# Patient Record
Sex: Female | Born: 1970 | Race: Black or African American | Hispanic: No | Marital: Single | Smoking: Never smoker
Health system: Southern US, Community
[De-identification: ages and names within clinical notes are randomized; demographics above are authoritative.]

## PROBLEM LIST (undated history)

## (undated) DIAGNOSIS — I10 Essential (primary) hypertension: Secondary | ICD-10-CM

---

## 2002-03-28 ENCOUNTER — Other Ambulatory Visit: Admission: RE | Admit: 2002-03-28 | Discharge: 2002-03-28 | Payer: Self-pay | Admitting: Family Medicine

## 2003-05-01 ENCOUNTER — Other Ambulatory Visit: Admission: RE | Admit: 2003-05-01 | Discharge: 2003-05-01 | Payer: Self-pay | Admitting: Family Medicine

## 2004-06-26 ENCOUNTER — Encounter: Admission: RE | Admit: 2004-06-26 | Discharge: 2004-06-26 | Payer: Self-pay | Admitting: Family Medicine

## 2004-09-29 ENCOUNTER — Other Ambulatory Visit: Admission: RE | Admit: 2004-09-29 | Discharge: 2004-09-29 | Payer: Self-pay | Admitting: Family Medicine

## 2005-09-28 ENCOUNTER — Other Ambulatory Visit: Admission: RE | Admit: 2005-09-28 | Discharge: 2005-09-28 | Payer: Self-pay | Admitting: Family Medicine

## 2007-10-04 ENCOUNTER — Other Ambulatory Visit: Admission: RE | Admit: 2007-10-04 | Discharge: 2007-10-04 | Payer: Self-pay | Admitting: Family Medicine

## 2008-10-05 ENCOUNTER — Other Ambulatory Visit: Admission: RE | Admit: 2008-10-05 | Discharge: 2008-10-05 | Payer: Self-pay | Admitting: Family Medicine

## 2009-08-30 ENCOUNTER — Encounter: Admission: RE | Admit: 2009-08-30 | Discharge: 2009-08-30 | Payer: Self-pay | Admitting: Family Medicine

## 2010-10-28 ENCOUNTER — Encounter: Admission: RE | Admit: 2010-10-28 | Discharge: 2010-10-28 | Payer: Self-pay | Admitting: Family Medicine

## 2010-11-11 ENCOUNTER — Encounter: Admission: RE | Admit: 2010-11-11 | Discharge: 2010-11-11 | Payer: Self-pay | Admitting: Family Medicine

## 2010-11-12 ENCOUNTER — Encounter: Admission: RE | Admit: 2010-11-12 | Discharge: 2010-11-12 | Payer: Self-pay | Admitting: Family Medicine

## 2011-10-08 ENCOUNTER — Other Ambulatory Visit: Payer: Self-pay | Admitting: Family Medicine

## 2011-10-08 DIAGNOSIS — Z1231 Encounter for screening mammogram for malignant neoplasm of breast: Secondary | ICD-10-CM

## 2011-10-30 ENCOUNTER — Other Ambulatory Visit: Payer: Self-pay | Admitting: Family Medicine

## 2011-10-30 DIAGNOSIS — E049 Nontoxic goiter, unspecified: Secondary | ICD-10-CM

## 2011-11-03 ENCOUNTER — Ambulatory Visit
Admission: RE | Admit: 2011-11-03 | Discharge: 2011-11-03 | Disposition: A | Discharge status: Home / Self Care | Payer: BC Managed Care – PPO | Source: Ambulatory Visit | Attending: Family Medicine | Admitting: Family Medicine

## 2011-11-03 DIAGNOSIS — E049 Nontoxic goiter, unspecified: Secondary | ICD-10-CM

## 2011-11-16 ENCOUNTER — Ambulatory Visit
Admission: RE | Admit: 2011-11-16 | Discharge: 2011-11-16 | Disposition: A | Discharge status: Home / Self Care | Payer: BC Managed Care – PPO | Source: Ambulatory Visit | Attending: Family Medicine | Admitting: Family Medicine

## 2011-11-16 DIAGNOSIS — Z1231 Encounter for screening mammogram for malignant neoplasm of breast: Secondary | ICD-10-CM

## 2012-02-01 ENCOUNTER — Other Ambulatory Visit: Payer: Self-pay | Admitting: Family Medicine

## 2012-02-01 DIAGNOSIS — E041 Nontoxic single thyroid nodule: Secondary | ICD-10-CM

## 2012-02-09 ENCOUNTER — Ambulatory Visit
Admission: RE | Admit: 2012-02-09 | Discharge: 2012-02-09 | Disposition: A | Discharge status: Home / Self Care | Payer: BC Managed Care – PPO | Source: Ambulatory Visit | Attending: Family Medicine | Admitting: Family Medicine

## 2012-02-09 ENCOUNTER — Other Ambulatory Visit (HOSPITAL_COMMUNITY)
Admission: RE | Admit: 2012-02-09 | Discharge: 2012-02-09 | Disposition: A | Discharge status: Home / Self Care | Payer: BC Managed Care – PPO | Source: Ambulatory Visit | Attending: Interventional Radiology | Admitting: Interventional Radiology

## 2012-02-09 DIAGNOSIS — E041 Nontoxic single thyroid nodule: Secondary | ICD-10-CM

## 2012-02-09 DIAGNOSIS — E049 Nontoxic goiter, unspecified: Secondary | ICD-10-CM | POA: Insufficient documentation

## 2012-11-01 ENCOUNTER — Other Ambulatory Visit: Payer: Self-pay | Admitting: Family Medicine

## 2012-11-01 ENCOUNTER — Other Ambulatory Visit (HOSPITAL_COMMUNITY)
Admission: RE | Admit: 2012-11-01 | Discharge: 2012-11-01 | Disposition: A | Discharge status: Home / Self Care | Payer: BC Managed Care – PPO | Source: Ambulatory Visit | Attending: Family Medicine | Admitting: Family Medicine

## 2012-11-01 DIAGNOSIS — Z1231 Encounter for screening mammogram for malignant neoplasm of breast: Secondary | ICD-10-CM

## 2012-11-01 DIAGNOSIS — Z1151 Encounter for screening for human papillomavirus (HPV): Secondary | ICD-10-CM | POA: Insufficient documentation

## 2012-11-01 DIAGNOSIS — Z124 Encounter for screening for malignant neoplasm of cervix: Secondary | ICD-10-CM | POA: Insufficient documentation

## 2012-12-06 ENCOUNTER — Ambulatory Visit
Admission: RE | Admit: 2012-12-06 | Discharge: 2012-12-06 | Disposition: A | Discharge status: Home / Self Care | Payer: BC Managed Care – PPO | Source: Ambulatory Visit | Attending: Family Medicine | Admitting: Family Medicine

## 2012-12-06 DIAGNOSIS — Z1231 Encounter for screening mammogram for malignant neoplasm of breast: Secondary | ICD-10-CM

## 2013-10-31 ENCOUNTER — Other Ambulatory Visit: Payer: Self-pay

## 2013-10-31 DIAGNOSIS — Z1231 Encounter for screening mammogram for malignant neoplasm of breast: Secondary | ICD-10-CM

## 2013-12-07 ENCOUNTER — Ambulatory Visit: Payer: BC Managed Care – PPO

## 2013-12-11 ENCOUNTER — Ambulatory Visit
Admission: RE | Admit: 2013-12-11 | Discharge: 2013-12-11 | Disposition: A | Discharge status: Home / Self Care | Payer: BC Managed Care – PPO | Source: Ambulatory Visit

## 2013-12-11 DIAGNOSIS — Z1231 Encounter for screening mammogram for malignant neoplasm of breast: Secondary | ICD-10-CM

## 2014-12-27 ENCOUNTER — Other Ambulatory Visit: Payer: Self-pay

## 2014-12-27 DIAGNOSIS — Z1231 Encounter for screening mammogram for malignant neoplasm of breast: Secondary | ICD-10-CM

## 2015-01-02 ENCOUNTER — Ambulatory Visit
Admission: RE | Admit: 2015-01-02 | Discharge: 2015-01-02 | Disposition: A | Discharge status: Home / Self Care | Payer: BLUE CROSS/BLUE SHIELD | Source: Ambulatory Visit

## 2015-01-02 DIAGNOSIS — Z1231 Encounter for screening mammogram for malignant neoplasm of breast: Secondary | ICD-10-CM

## 2015-12-02 ENCOUNTER — Other Ambulatory Visit: Payer: Self-pay

## 2015-12-02 DIAGNOSIS — Z1231 Encounter for screening mammogram for malignant neoplasm of breast: Secondary | ICD-10-CM

## 2016-01-06 ENCOUNTER — Inpatient Hospital Stay: Admission: RE | Admit: 2016-01-06 | Payer: BLUE CROSS/BLUE SHIELD | Source: Ambulatory Visit

## 2016-01-16 ENCOUNTER — Ambulatory Visit
Admission: RE | Admit: 2016-01-16 | Discharge: 2016-01-16 | Disposition: A | Discharge status: Home / Self Care | Payer: BLUE CROSS/BLUE SHIELD | Source: Ambulatory Visit

## 2016-01-16 DIAGNOSIS — Z1231 Encounter for screening mammogram for malignant neoplasm of breast: Secondary | ICD-10-CM

## 2016-01-28 ENCOUNTER — Ambulatory Visit
Admission: RE | Admit: 2016-01-28 | Discharge: 2016-01-28 | Disposition: A | Discharge status: Home / Self Care | Payer: BLUE CROSS/BLUE SHIELD | Source: Ambulatory Visit

## 2016-12-23 ENCOUNTER — Other Ambulatory Visit: Payer: Self-pay | Admitting: Family Medicine

## 2016-12-23 DIAGNOSIS — Z1231 Encounter for screening mammogram for malignant neoplasm of breast: Secondary | ICD-10-CM

## 2017-01-29 ENCOUNTER — Ambulatory Visit
Admission: RE | Admit: 2017-01-29 | Discharge: 2017-01-29 | Disposition: A | Discharge status: Home / Self Care | Payer: BLUE CROSS/BLUE SHIELD | Source: Ambulatory Visit | Attending: Family Medicine | Admitting: Family Medicine

## 2017-01-29 DIAGNOSIS — Z1231 Encounter for screening mammogram for malignant neoplasm of breast: Secondary | ICD-10-CM

## 2017-11-29 ENCOUNTER — Other Ambulatory Visit: Payer: Self-pay | Admitting: Family Medicine

## 2017-11-29 ENCOUNTER — Other Ambulatory Visit (HOSPITAL_COMMUNITY)
Admission: RE | Admit: 2017-11-29 | Discharge: 2017-11-29 | Disposition: A | Discharge status: Home / Self Care | Payer: BLUE CROSS/BLUE SHIELD | Source: Ambulatory Visit | Attending: Family Medicine | Admitting: Family Medicine

## 2017-11-29 DIAGNOSIS — R7303 Prediabetes: Secondary | ICD-10-CM | POA: Diagnosis not present

## 2017-11-29 DIAGNOSIS — Z124 Encounter for screening for malignant neoplasm of cervix: Secondary | ICD-10-CM | POA: Insufficient documentation

## 2017-11-29 DIAGNOSIS — I10 Essential (primary) hypertension: Secondary | ICD-10-CM | POA: Diagnosis not present

## 2017-11-29 DIAGNOSIS — E042 Nontoxic multinodular goiter: Secondary | ICD-10-CM | POA: Diagnosis not present

## 2017-11-29 DIAGNOSIS — Z800 Family history of malignant neoplasm of digestive organs: Secondary | ICD-10-CM | POA: Diagnosis not present

## 2017-11-29 DIAGNOSIS — N926 Irregular menstruation, unspecified: Secondary | ICD-10-CM | POA: Diagnosis not present

## 2017-11-29 DIAGNOSIS — Z0000 Encounter for general adult medical examination without abnormal findings: Secondary | ICD-10-CM | POA: Diagnosis not present

## 2017-11-29 DIAGNOSIS — E6609 Other obesity due to excess calories: Secondary | ICD-10-CM | POA: Diagnosis not present

## 2017-11-30 ENCOUNTER — Other Ambulatory Visit: Payer: Self-pay | Admitting: Family Medicine

## 2017-11-30 DIAGNOSIS — E049 Nontoxic goiter, unspecified: Secondary | ICD-10-CM

## 2017-11-30 LAB — CYTOLOGY - PAP
Adequacy: ABSENT
Diagnosis: NEGATIVE
HPV: NOT DETECTED

## 2017-12-02 ENCOUNTER — Ambulatory Visit
Admission: RE | Admit: 2017-12-02 | Discharge: 2017-12-02 | Disposition: A | Discharge status: Home / Self Care | Payer: BLUE CROSS/BLUE SHIELD | Source: Ambulatory Visit | Attending: Family Medicine | Admitting: Family Medicine

## 2017-12-02 DIAGNOSIS — E049 Nontoxic goiter, unspecified: Secondary | ICD-10-CM

## 2017-12-02 DIAGNOSIS — E041 Nontoxic single thyroid nodule: Secondary | ICD-10-CM | POA: Diagnosis not present

## 2017-12-13 ENCOUNTER — Other Ambulatory Visit: Payer: Self-pay | Admitting: Family Medicine

## 2017-12-13 DIAGNOSIS — E042 Nontoxic multinodular goiter: Secondary | ICD-10-CM

## 2018-01-10 ENCOUNTER — Other Ambulatory Visit: Payer: Self-pay | Admitting: Family Medicine

## 2018-01-10 DIAGNOSIS — Z139 Encounter for screening, unspecified: Secondary | ICD-10-CM

## 2018-02-01 ENCOUNTER — Ambulatory Visit
Admission: RE | Admit: 2018-02-01 | Discharge: 2018-02-01 | Disposition: A | Discharge status: Home / Self Care | Payer: BLUE CROSS/BLUE SHIELD | Source: Ambulatory Visit | Attending: Family Medicine | Admitting: Family Medicine

## 2018-02-01 DIAGNOSIS — Z1231 Encounter for screening mammogram for malignant neoplasm of breast: Secondary | ICD-10-CM | POA: Diagnosis not present

## 2018-02-01 DIAGNOSIS — Z139 Encounter for screening, unspecified: Secondary | ICD-10-CM

## 2018-03-19 DIAGNOSIS — J309 Allergic rhinitis, unspecified: Secondary | ICD-10-CM | POA: Diagnosis not present

## 2018-03-30 DIAGNOSIS — J309 Allergic rhinitis, unspecified: Secondary | ICD-10-CM | POA: Diagnosis not present

## 2018-06-03 ENCOUNTER — Ambulatory Visit
Admission: RE | Admit: 2018-06-03 | Discharge: 2018-06-03 | Disposition: A | Discharge status: Home / Self Care | Payer: BLUE CROSS/BLUE SHIELD | Source: Ambulatory Visit | Attending: Family Medicine | Admitting: Family Medicine

## 2018-06-03 DIAGNOSIS — E041 Nontoxic single thyroid nodule: Secondary | ICD-10-CM | POA: Diagnosis not present

## 2018-06-03 DIAGNOSIS — E042 Nontoxic multinodular goiter: Secondary | ICD-10-CM

## 2018-06-08 ENCOUNTER — Other Ambulatory Visit: Payer: Self-pay | Admitting: Family Medicine

## 2018-06-15 ENCOUNTER — Other Ambulatory Visit: Payer: Self-pay | Admitting: Family Medicine

## 2018-06-15 DIAGNOSIS — E041 Nontoxic single thyroid nodule: Secondary | ICD-10-CM

## 2018-06-29 ENCOUNTER — Ambulatory Visit
Admission: RE | Admit: 2018-06-29 | Discharge: 2018-06-29 | Disposition: A | Discharge status: Home / Self Care | Payer: BLUE CROSS/BLUE SHIELD | Source: Ambulatory Visit | Attending: Family Medicine | Admitting: Family Medicine

## 2018-06-29 ENCOUNTER — Other Ambulatory Visit (HOSPITAL_COMMUNITY)
Admission: RE | Admit: 2018-06-29 | Discharge: 2018-06-29 | Disposition: A | Discharge status: Home / Self Care | Payer: BLUE CROSS/BLUE SHIELD | Source: Ambulatory Visit | Attending: Radiology | Admitting: Radiology

## 2018-06-29 DIAGNOSIS — E041 Nontoxic single thyroid nodule: Secondary | ICD-10-CM | POA: Diagnosis not present

## 2018-09-29 IMAGING — US US THYROID
1 series · 12 of 25 positions shown · non-contrast
Comparison: Most recent prior thyroid ultrasound 12/02/2017; prior
thyroid nodule biopsy 02/09/2012; prior thyroid ultrasound
11/03/2011

CLINICAL DATA: Goiter. 47-year-old female with a history of
multinodular thyroid goiter. She underwent prior biopsy the right
inferior nodule in Sunday January, 2012.

EXAM:
THYROID ULTRASOUND
TECHNIQUE: Ultrasound examination of the thyroid gland and adjacent soft
tissues was performed.

[Series 1: us thyroid · 0.06mm/px · 12 of 56 slices shown]
[im 3/56]
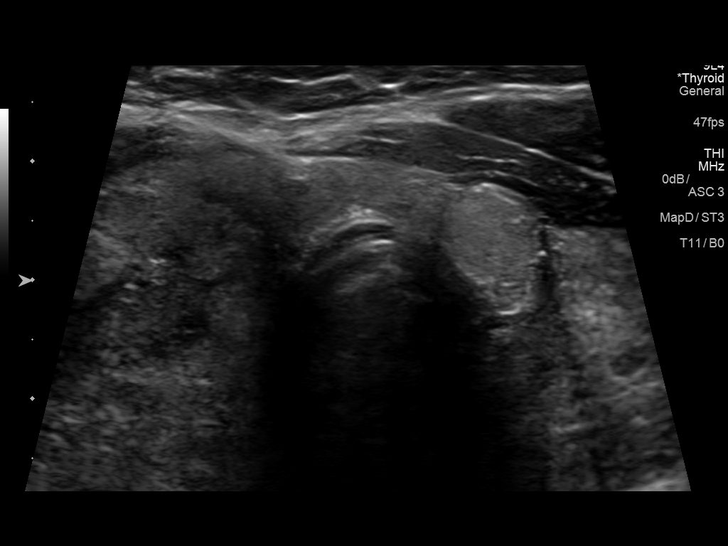
[im 7/56]
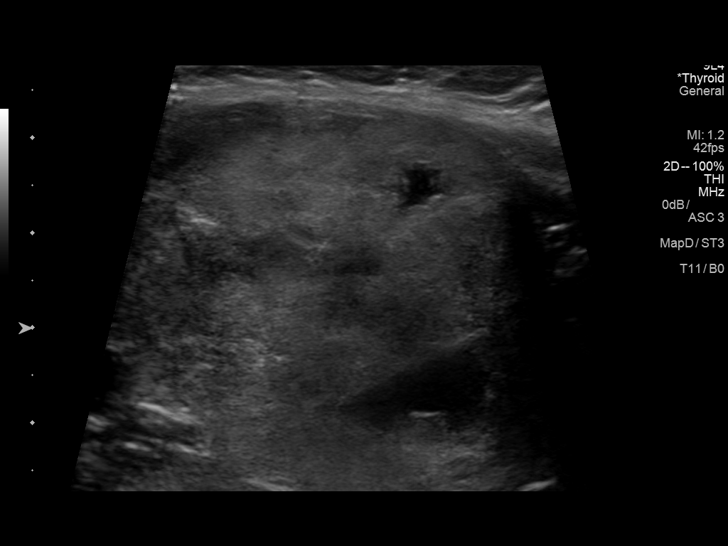
[im 12/56]
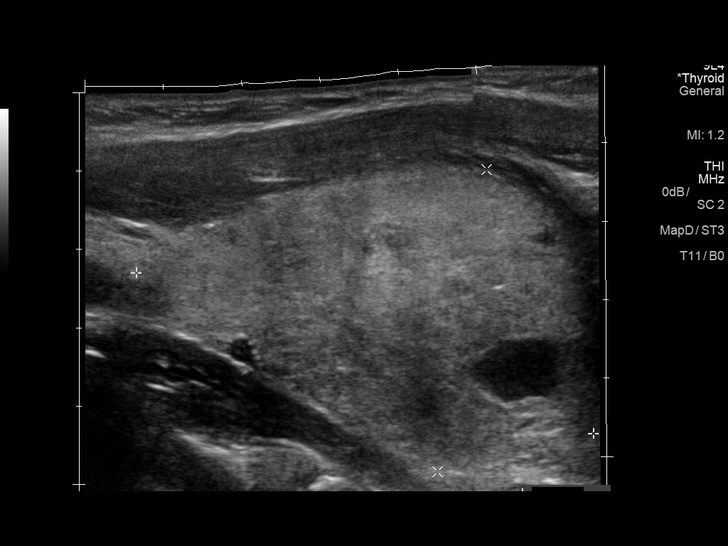
[im 17/56]
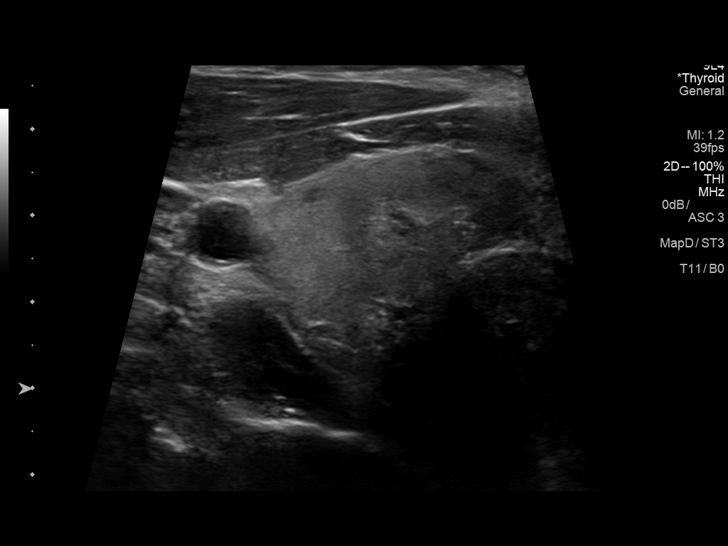
[im 21/56]
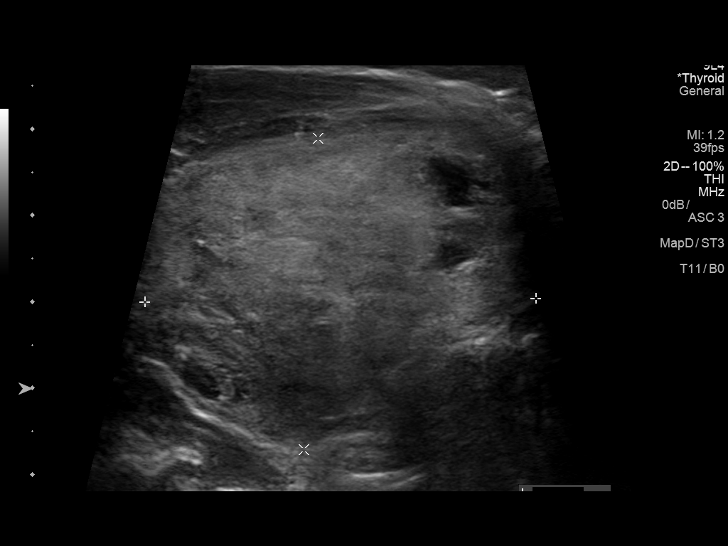
[im 26/56]
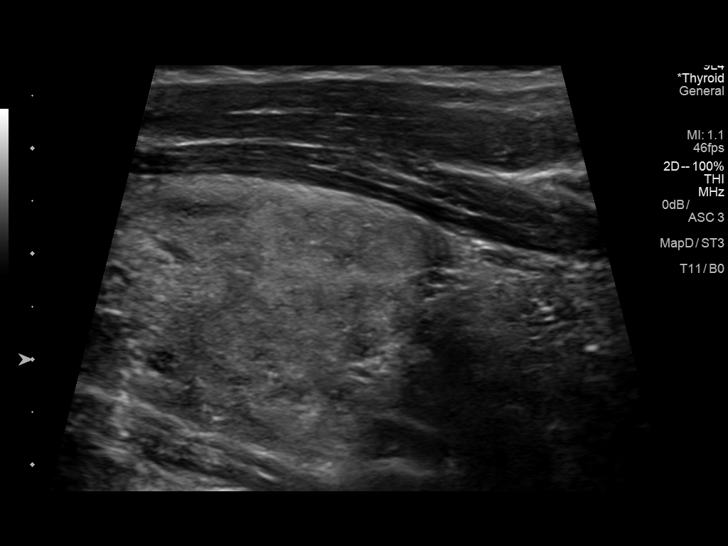
[im 30/56]
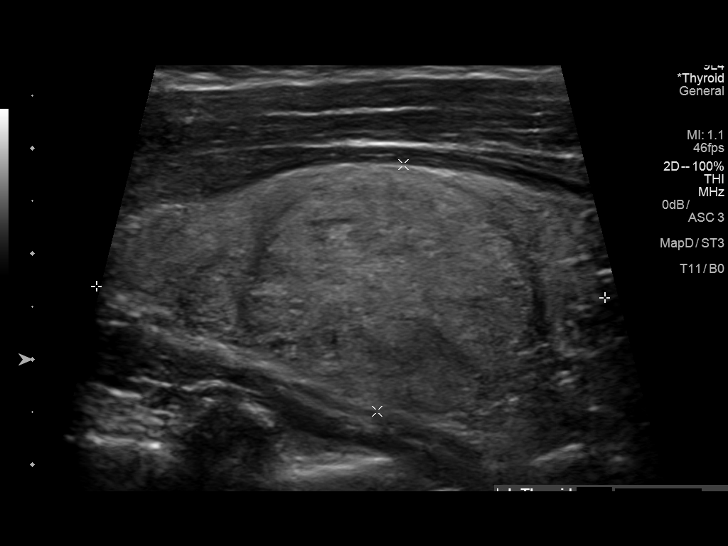
[im 35/56]
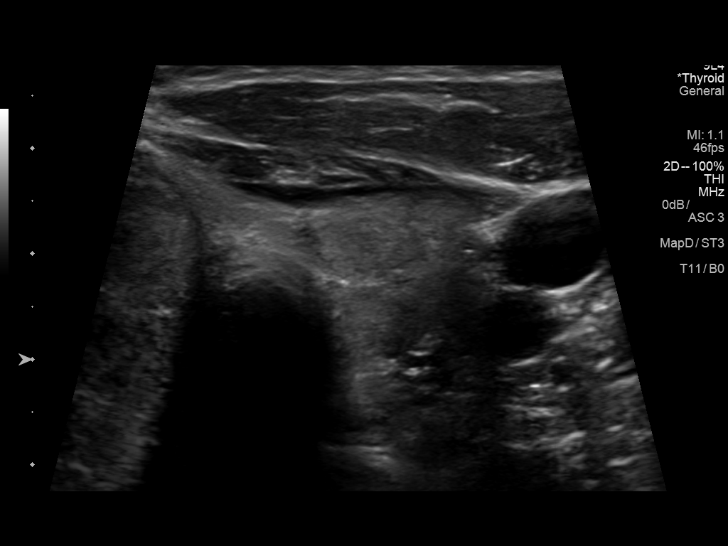
[im 39/56]
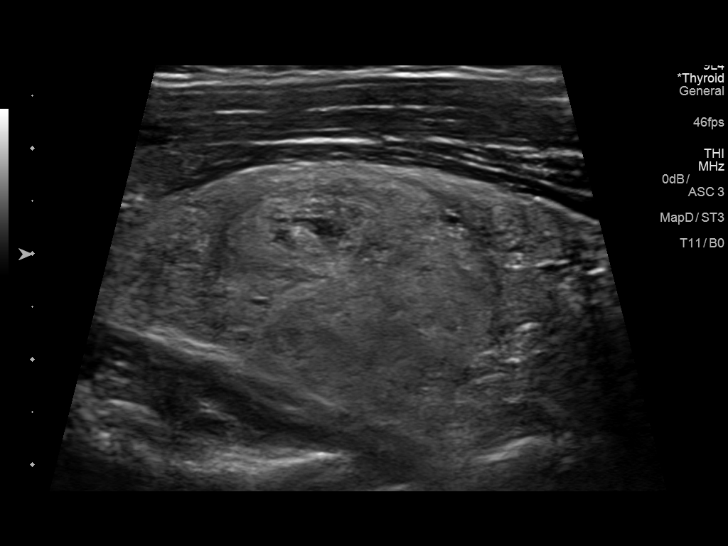
[im 44/56]
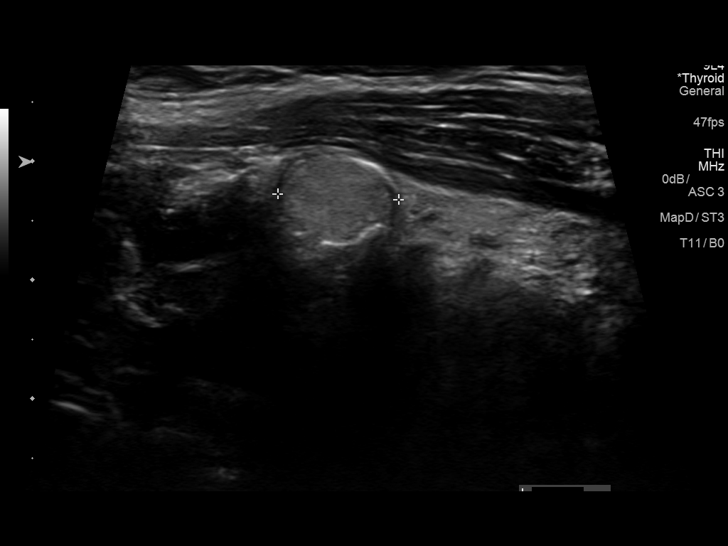
[im 49/56]
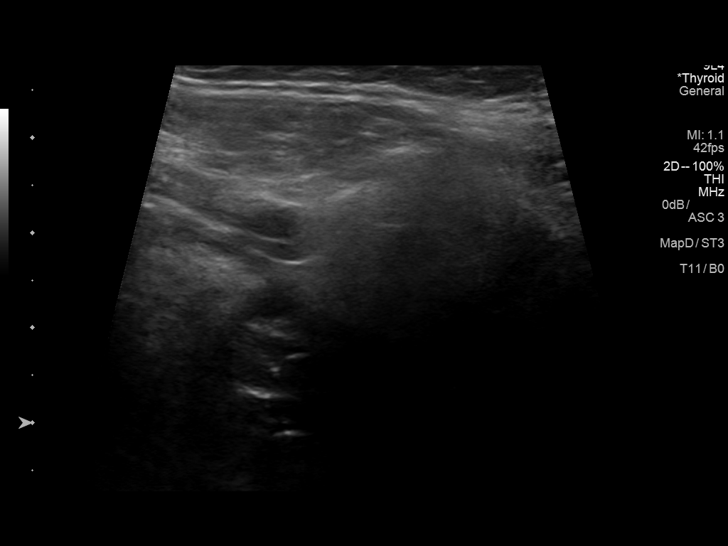
[im 53/56]
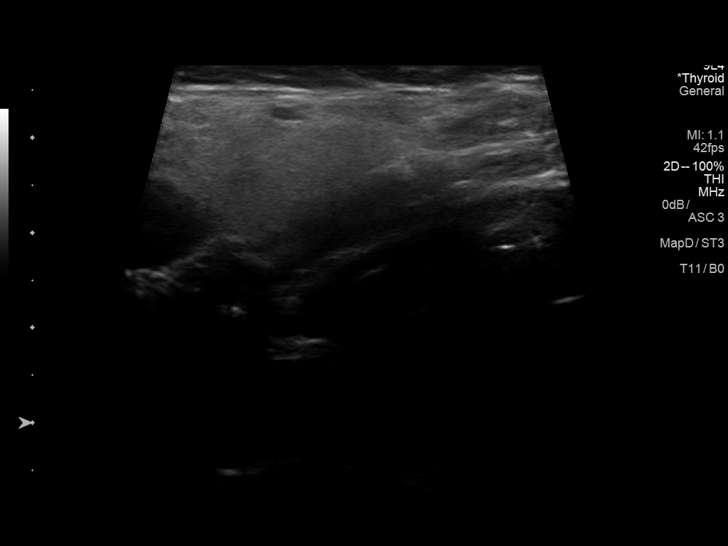

[12 of 25 positions shown; findings below may reference images not displayed]

FINDINGS: Parenchymal Echotexture: Markedly heterogenous

Isthmus: 0.5 cm

Right lobe: 6.2 x 3.9 x 4.1 cm

Left lobe: 5.1 x 2.3 x 2.4 cm

_________________________________________________________

Estimated total number of nodules >/= 1 cm: 4

Number of spongiform nodules >/=  2 cm not described below (TR1): 0

Number of mixed cystic and solid nodules >/= 1.5 cm not described
below (TR2): 0

_________________________________________________________

Nodule # 3:

Prior biopsy: No

Location: Left; Mid

Maximum size: 2.6 cm; Other 2 dimensions: 2.1 x 2.1 cm, previously,
2.4 x 1.9 x 2.0 cm

Composition: solid/almost completely solid (2)

Echogenicity: isoechoic (1)

Shape: not taller-than-wide (0)

Margins: smooth (0)

Echogenic foci: none (0)

ACR TI-RADS total points: 3.

ACR TI-RADS risk category:  TR3 (3 points).

Significant change in size (>/= 20% in two dimensions and minimal
increase of 2 mm): Yes

Change in features: No

Change in ACR TI-RADS risk category: No

ACR TI-RADS recommendations:

**Given size (>/= 2.5 cm) and appearance, fine needle aspiration of
this mildly suspicious nodule should be considered based on TI-RADS
criteria.

_________________________________________________________

Nodule # 4:

Prior biopsy: No

Location: Left; Mid

Maximum size: 1.0 cm; Other 2 dimensions: 0.9 x 0.8 cm, previously,
1.1 x 0.8 x 0.8 cm

Composition: solid/almost completely solid (2)

Echogenicity: isoechoic (1)

Shape: not taller-than-wide (0)

Margins: smooth (0)

Echogenic foci: peripheral calcifications (2)

ACR TI-RADS total points: 5.

ACR TI-RADS risk category:  TR4 (4-6 points).

Significant change in size (>/= 20% in two dimensions and minimal
increase of 2 mm): No

Change in features: No

Change in ACR TI-RADS risk category: No

ACR TI-RADS recommendations:

Nodule remains stable dating back to 8218 confirming greater than 5
year stability. No further follow-up required.

_________________________________________________________

The previously biopsied mass occupying the majority of the right
inferior gland now measures 4.5 x 4.2 x 3.6 cm, very similar
compared to 4.4 x 3.6 x 2.6 cm previously. Precise measurements are
difficult given the large size of the lesion. Small TI-RADS category
3 nodule in the left superior gland does not meet size criteria for
further evaluation.
IMPRESSION: 1. Probable continued slow interval growth of the previously
biopsied right inferior thyroid nodule. Recommend correlation with
prior biopsy results.
2. Interval growth of the TI-RADS category 3 nodule in the left mid
gland. This nodule now meets criteria for consideration of
fine-needle aspiration biopsy.
3. Confirmed 5 year stability of the peripherally calcified TI-RADS
category 4 nodule in the left medial mid gland. This is consistent
with benignity and no further follow-up is required for this nodule.

The above is in keeping with the ACR TI-RADS recommendations - [HOSPITAL] 3482;[DATE].

## 2018-11-30 DIAGNOSIS — D649 Anemia, unspecified: Secondary | ICD-10-CM | POA: Diagnosis not present

## 2018-11-30 DIAGNOSIS — Z6839 Body mass index (BMI) 39.0-39.9, adult: Secondary | ICD-10-CM | POA: Diagnosis not present

## 2018-11-30 DIAGNOSIS — R7303 Prediabetes: Secondary | ICD-10-CM | POA: Diagnosis not present

## 2018-11-30 DIAGNOSIS — Z0000 Encounter for general adult medical examination without abnormal findings: Secondary | ICD-10-CM | POA: Diagnosis not present

## 2018-11-30 DIAGNOSIS — I10 Essential (primary) hypertension: Secondary | ICD-10-CM | POA: Diagnosis not present

## 2018-12-30 ENCOUNTER — Other Ambulatory Visit: Payer: Self-pay | Admitting: Family Medicine

## 2018-12-30 DIAGNOSIS — Z1231 Encounter for screening mammogram for malignant neoplasm of breast: Secondary | ICD-10-CM

## 2019-02-02 ENCOUNTER — Ambulatory Visit
Admission: RE | Admit: 2019-02-02 | Discharge: 2019-02-02 | Disposition: A | Discharge status: Home / Self Care | Payer: BLUE CROSS/BLUE SHIELD | Source: Ambulatory Visit | Attending: Family Medicine | Admitting: Family Medicine

## 2019-02-02 DIAGNOSIS — Z1231 Encounter for screening mammogram for malignant neoplasm of breast: Secondary | ICD-10-CM | POA: Diagnosis not present

## 2019-02-03 ENCOUNTER — Other Ambulatory Visit: Payer: Self-pay | Admitting: Family Medicine

## 2019-02-03 DIAGNOSIS — R928 Other abnormal and inconclusive findings on diagnostic imaging of breast: Secondary | ICD-10-CM

## 2019-02-08 ENCOUNTER — Other Ambulatory Visit: Payer: Self-pay | Admitting: Family Medicine

## 2019-02-08 ENCOUNTER — Ambulatory Visit
Admission: RE | Admit: 2019-02-08 | Discharge: 2019-02-08 | Disposition: A | Discharge status: Home / Self Care | Payer: BLUE CROSS/BLUE SHIELD | Source: Ambulatory Visit | Attending: Family Medicine | Admitting: Family Medicine

## 2019-02-08 DIAGNOSIS — N63 Unspecified lump in unspecified breast: Secondary | ICD-10-CM

## 2019-02-08 DIAGNOSIS — N6489 Other specified disorders of breast: Secondary | ICD-10-CM | POA: Diagnosis not present

## 2019-02-08 DIAGNOSIS — R928 Other abnormal and inconclusive findings on diagnostic imaging of breast: Secondary | ICD-10-CM

## 2019-08-10 ENCOUNTER — Other Ambulatory Visit: Payer: Self-pay | Admitting: Family Medicine

## 2019-08-10 ENCOUNTER — Ambulatory Visit
Admission: RE | Admit: 2019-08-10 | Discharge: 2019-08-10 | Disposition: A | Discharge status: Home / Self Care | Payer: BC Managed Care – PPO | Source: Ambulatory Visit | Attending: Family Medicine | Admitting: Family Medicine

## 2019-08-10 ENCOUNTER — Other Ambulatory Visit: Payer: Self-pay

## 2019-08-10 ENCOUNTER — Ambulatory Visit: Admission: RE | Admit: 2019-08-10 | Payer: BLUE CROSS/BLUE SHIELD | Source: Ambulatory Visit

## 2019-08-10 DIAGNOSIS — N63 Unspecified lump in unspecified breast: Secondary | ICD-10-CM

## 2019-08-10 DIAGNOSIS — R928 Other abnormal and inconclusive findings on diagnostic imaging of breast: Secondary | ICD-10-CM | POA: Diagnosis not present

## 2019-12-11 DIAGNOSIS — I10 Essential (primary) hypertension: Secondary | ICD-10-CM | POA: Diagnosis not present

## 2019-12-11 DIAGNOSIS — D649 Anemia, unspecified: Secondary | ICD-10-CM | POA: Diagnosis not present

## 2020-02-13 ENCOUNTER — Other Ambulatory Visit: Payer: Self-pay

## 2020-02-13 ENCOUNTER — Ambulatory Visit
Admission: RE | Admit: 2020-02-13 | Discharge: 2020-02-13 | Disposition: A | Discharge status: Home / Self Care | Payer: BC Managed Care – PPO | Source: Ambulatory Visit | Attending: Family Medicine | Admitting: Family Medicine

## 2020-02-13 DIAGNOSIS — N63 Unspecified lump in unspecified breast: Secondary | ICD-10-CM

## 2020-12-13 ENCOUNTER — Ambulatory Visit: Payer: Self-pay | Attending: Internal Medicine

## 2020-12-13 DIAGNOSIS — Z23 Encounter for immunization: Secondary | ICD-10-CM

## 2020-12-13 NOTE — Progress Notes (Signed)
   Covid-19 Vaccination Clinic  Name:  Lauren Le    MRN: [id number] DOB: [birth year]  12/13/2020  [name] was observed post Covid-19 immunization for 15 minutes without incident. She was provided with Vaccine Information Sheet and instruction to access the V-Safe system.   [name] was instructed to call 911 with any severe reactions post vaccine: Marland Kitchen Difficulty breathing  . Swelling of face and throat  . A fast heartbeat  . A bad rash all over body  . Dizziness and weakness   Immunizations Administered    Name Date Dose VIS Date Route   Pfizer COVID-19 Vaccine 12/13/2020  5:11 PM 0.3 mL 10/16/2020 Intramuscular   Manufacturer: [employer], [employer]   Lot: [id number]   NDC: [address]-6

## 2021-01-09 ENCOUNTER — Other Ambulatory Visit: Payer: Self-pay | Admitting: Family Medicine

## 2021-01-09 DIAGNOSIS — N6320 Unspecified lump in the left breast, unspecified quadrant: Secondary | ICD-10-CM

## 2021-02-25 ENCOUNTER — Ambulatory Visit
Admission: RE | Admit: 2021-02-25 | Discharge: 2021-02-25 | Disposition: A | Discharge status: Home / Self Care | Payer: 59 | Source: Ambulatory Visit | Attending: Family Medicine | Admitting: Family Medicine

## 2021-02-25 ENCOUNTER — Other Ambulatory Visit: Payer: Self-pay

## 2021-02-25 DIAGNOSIS — N6320 Unspecified lump in the left breast, unspecified quadrant: Secondary | ICD-10-CM

## 2022-01-19 ENCOUNTER — Other Ambulatory Visit: Payer: Self-pay | Admitting: Family Medicine

## 2022-01-19 DIAGNOSIS — Z1231 Encounter for screening mammogram for malignant neoplasm of breast: Secondary | ICD-10-CM

## 2022-03-03 ENCOUNTER — Ambulatory Visit
Admission: RE | Admit: 2022-03-03 | Discharge: 2022-03-03 | Disposition: A | Discharge status: Home / Self Care | Payer: No Typology Code available for payment source | Source: Ambulatory Visit | Attending: Family Medicine | Admitting: Family Medicine

## 2022-03-03 DIAGNOSIS — Z1231 Encounter for screening mammogram for malignant neoplasm of breast: Secondary | ICD-10-CM

## 2022-10-23 IMAGING — MG DIGITAL DIAGNOSTIC BILAT W/ TOMO W/ CAD
8 of 15 series · 8 of 40 positions shown · non-contrast
Comparison: Previous exam(s).

CLINICAL DATA: 50-year-old female presenting for follow-up of a
likely benign left breast mass without sonographic correlate.

EXAM:
DIGITAL DIAGNOSTIC BILATERAL MAMMOGRAM WITH TOMOSYNTHESIS AND CAD
TECHNIQUE: Bilateral digital diagnostic mammography and breast tomosynthesis
was performed. The images were evaluated with computer-aided
detection.

[L MLO synth-2D (1 of 2)]
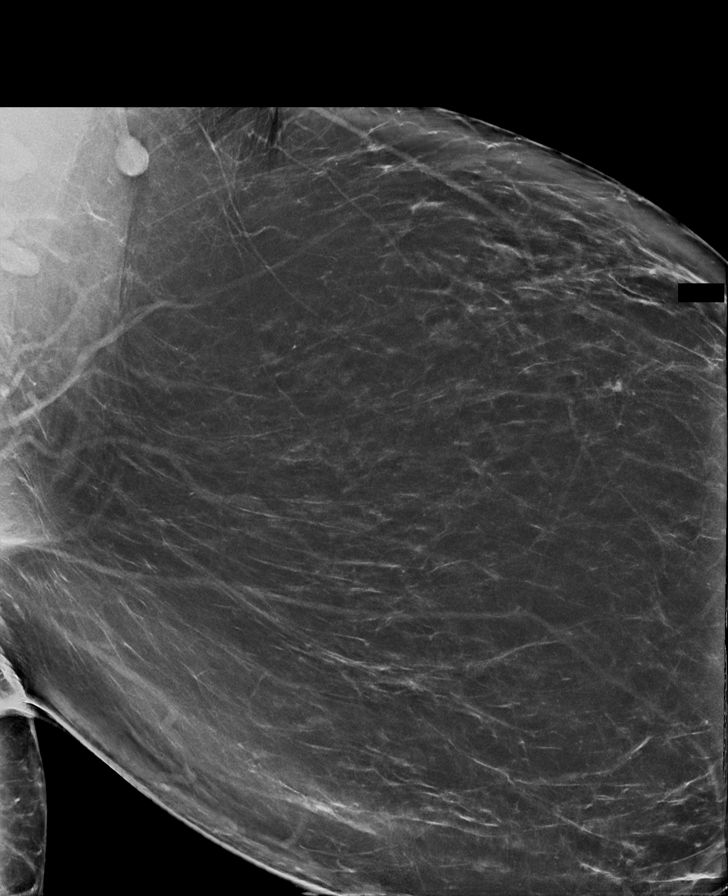

[R CC synth-2D (1 of 2)]
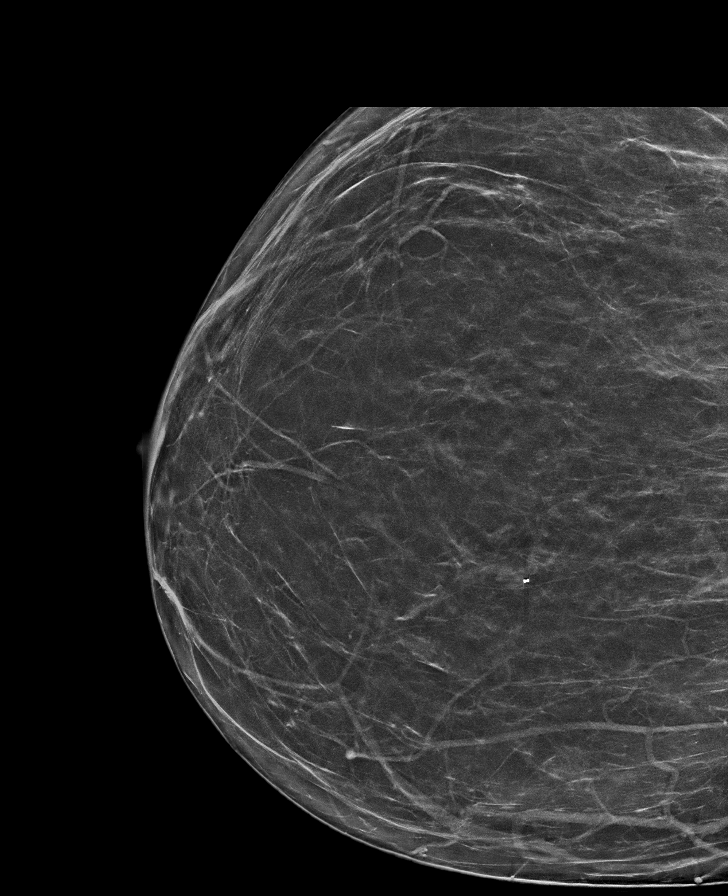

[R CC synth-2D (2 of 2)]
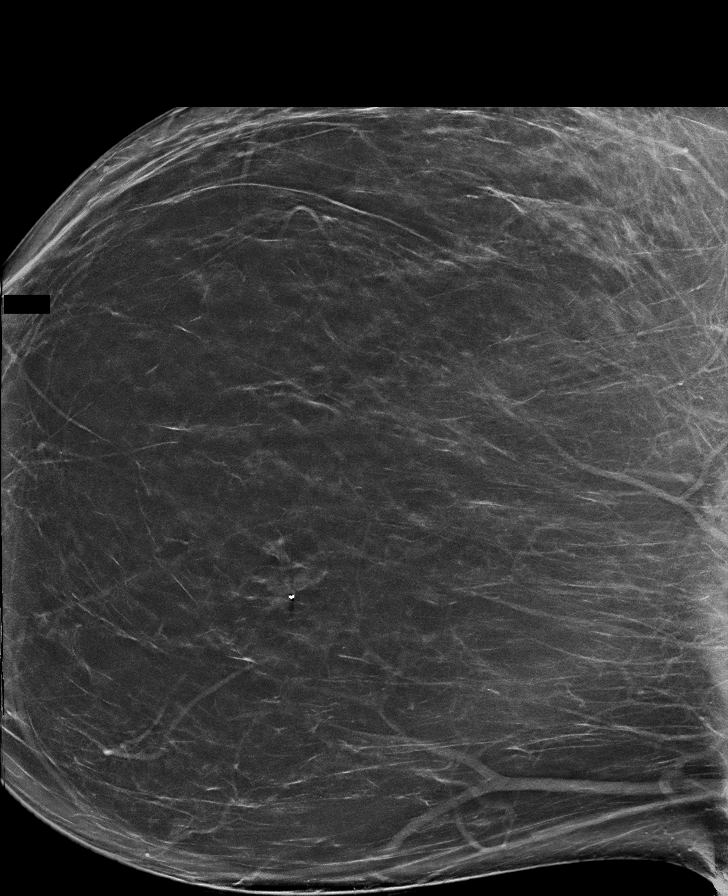

[R MLO synth-2D (1 of 2)]
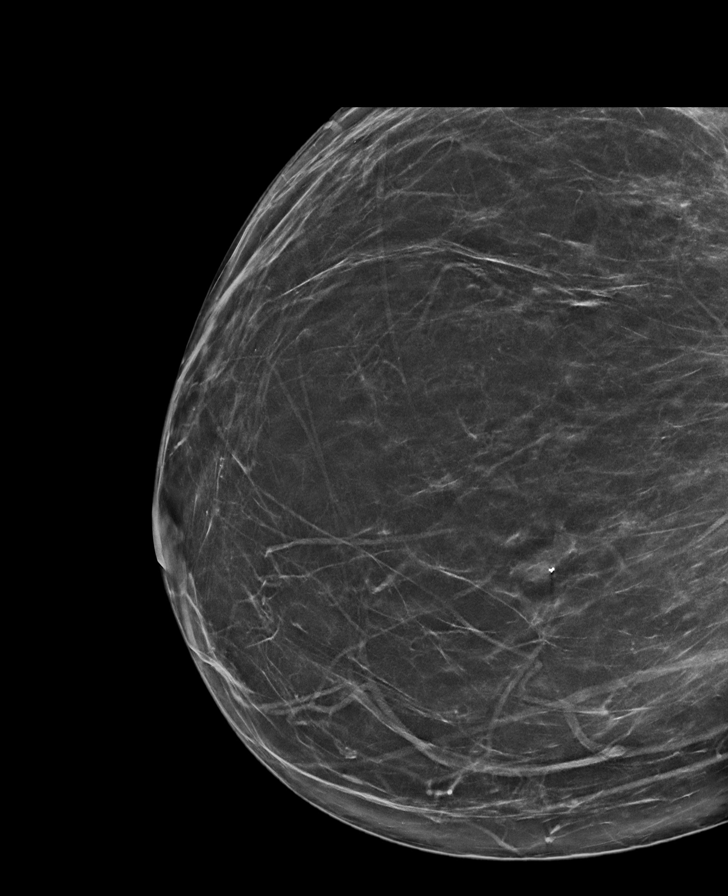

[L CC synth-2D]
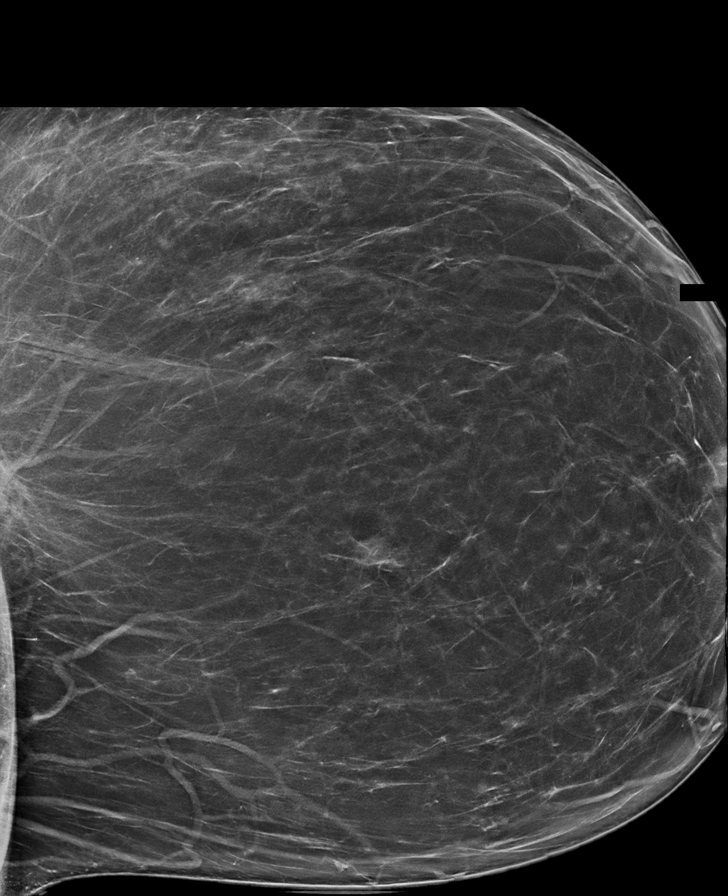

[L MLO synth-2D (2 of 2)]
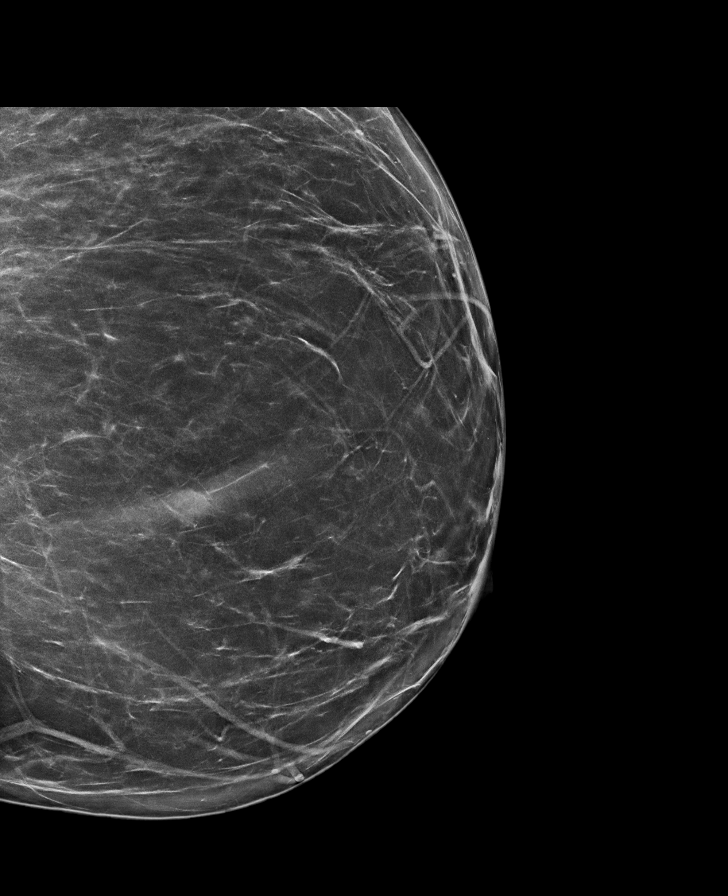

[R MLO synth-2D (2 of 2)]
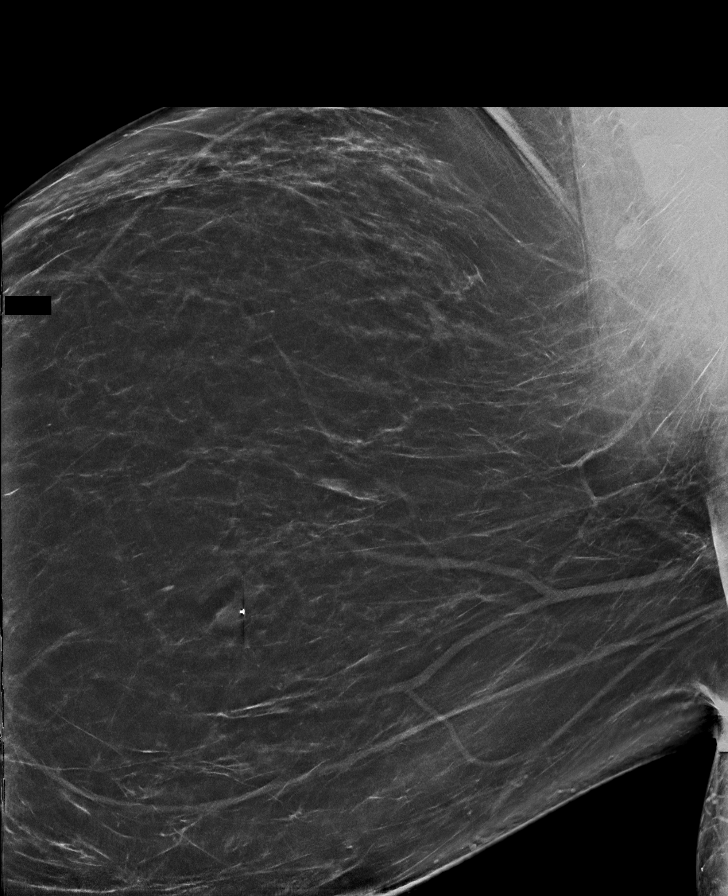

[R CC tomo · tomo slice 53/77.0]
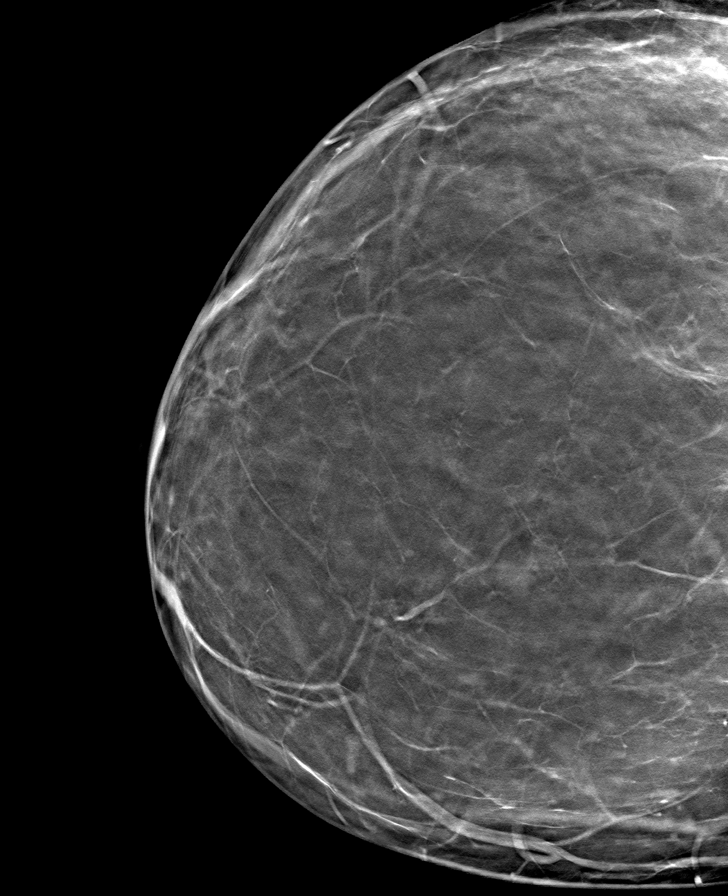

[8 of 40 positions shown; findings below may reference images not displayed]

ACR Breast Density Category b: There are scattered areas of
fibroglandular density.
FINDINGS: The mass in the inferior left breast is mammographically stable. No
suspicious calcifications, masses or areas of distortion are seen in
the bilateral breasts.
IMPRESSION: 1. The left breast mass has demonstrated 2 years of stability, and
is therefore benign.

2.  No evidence of malignancy in the bilateral breasts.

RECOMMENDATION:
Screening mammogram in one year.(Code:O9-0-3VN)

I have discussed the findings and recommendations with the patient.
If applicable, a reminder letter will be sent to the patient
regarding the next appointment.

BI-RADS CATEGORY  2: Benign.

## 2023-03-23 ENCOUNTER — Ambulatory Visit
Admission: EM | Admit: 2023-03-23 | Discharge: 2023-03-23 | Disposition: A | Discharge status: Home / Self Care | Payer: No Typology Code available for payment source | Attending: Urgent Care | Admitting: Urgent Care

## 2023-03-23 ENCOUNTER — Ambulatory Visit (INDEPENDENT_AMBULATORY_CARE_PROVIDER_SITE_OTHER): Payer: No Typology Code available for payment source

## 2023-03-23 DIAGNOSIS — S63112A Subluxation of metacarpophalangeal joint of left thumb, initial encounter: Secondary | ICD-10-CM

## 2023-03-23 DIAGNOSIS — M79645 Pain in left finger(s): Secondary | ICD-10-CM

## 2023-03-23 HISTORY — DX: Essential (primary) hypertension: I10

## 2023-03-23 MED ORDER — TRAMADOL HCL 50 MG PO TABS: 50.0000 mg | ORAL_TABLET | Freq: Four times a day (QID) | ORAL | 0 refills | Status: DC | PRN

## 2023-03-23 NOTE — Discharge Instructions (Addendum)
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 Please schedule Tylenol at 500 mg - 650 mg once every 6 hours as needed for aches and pains.  If you still have pain despite taking Tylenol regularly, this is breakthrough pain.  You can use tramadol once every 6 hours for this.  Once your pain is better controlled, switch back to just Tylenol.  Use the thumb spica splint during the day.  Follow-up as soon as possible with a hand specialist.

## 2023-03-23 NOTE — ED Triage Notes (Signed)
Pt c/o thumb pain and tenderness pt states she can still feel the thumb  and move it with minimal issue, x 2 weeks  Denies any injury  Soaking it, muscle cream, wearing hand compression sleeve

## 2023-03-23 NOTE — ED Provider Notes (Signed)
[facility]  Note:  This document was prepared using Systems analyst and may include unintentional dictation errors.  MRN: [id number] DOB: [birth year]  Subjective:   Lauren Le is a 52 y.o. female presenting for 2-week history of acute onset persistent left thumb/hand pain.  No fall, trauma, weakness, numbness or tingling, rash.  Patient has been using topical therapy and a compression sleeve with minimal relief.  She is also been using Tylenol.  Patient is not currently working, but was laid off.  Prior to that she was doing a lot of typing.  She does have a history of hypertension, takes losartan hydrochlorothiazide.  No current facility-administered medications for this encounter.  Current Outpatient Medications:    losartan-hydrochlorothiazide (HYZAAR) 50-12.5 MG tablet, Take 1 tablet by mouth daily., Disp: , Rfl:    Allergies  Allergen Reactions   Codeine Nausea And Vomiting    Past Medical History:  Diagnosis Date   Hypertension     History reviewed. No pertinent surgical history.  History reviewed. No pertinent family history.  Social History   Tobacco Use   Smoking status: Never   Smokeless tobacco: Never  Vaping Use   Vaping Use: Never used  Substance Use Topics   Alcohol use: Yes   Drug use: Not Currently    ROS   Objective:   Vitals: BP (!) 163/111 (BP Location: Left Arm)   Pulse 93   Temp 99.6 F (37.6 C) (Oral)   LMP 03/20/2023 (Exact Date)   SpO2 97%   Physical Exam Constitutional:      General: She is not in acute distress.    Appearance: Normal appearance. She is well-developed. She is not ill-appearing, toxic-appearing or diaphoretic.  HENT:     Head: Normocephalic and atraumatic.     Nose: Nose normal.     Mouth/Throat:     Mouth: Mucous membranes are moist.  Eyes:     General: No scleral icterus.       Right eye: No discharge.        Left eye: No discharge.     Extraocular Movements:  Extraocular movements intact.  Cardiovascular:     Rate and Rhythm: Normal rate.  Pulmonary:     Effort: Pulmonary effort is normal.  Musculoskeletal:       Hands:  Skin:    General: Skin is warm and dry.  Neurological:     General: No focal deficit present.     Mental Status: She is alert and oriented to person, place, and time.  Psychiatric:        Mood and Affect: Mood normal.        Behavior: Behavior normal.     DG Hand Complete Left  Result Date: 03/23/2023 CLINICAL DATA:  Thumb pain and tenderness EXAM: LEFT HAND - COMPLETE 3+ VIEW COMPARISON:  None Available. FINDINGS: There is subluxation at the metacarpal phalangeal joint of the thumb with mild degenerative change. No other regional articular or focal bone finding. Findings most likely degenerative in nature. 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 IMPRESSION: 1. Subluxation at the metacarpophalangeal joint of the thumb with mild degenerative change. Findings most likely degenerative in nature. Electronically Signed   By: Nelson Chimes M.D.   On: 03/23/2023 16:15     Assessment and Plan :   PDMP not reviewed this encounter.  1. Subluxation of metacarpophalangeal joint of left thumb, initial encounter   2. Pain of left thumb  Thumb spica splint applied to the left hand. Emphasized need for follow up with hand specialist at [facility].  Schedule Tylenol for pain relief, use tramadol for breakthrough pain. Counseled patient on potential for adverse effects with medications prescribed/recommended today, ER and return-to-clinic precautions discussed, patient verbalized understanding.    Jaynee Eagles, [place] 03/23/23 1649

## 2023-04-16 ENCOUNTER — Other Ambulatory Visit: Payer: Self-pay | Admitting: Obstetrics and Gynecology

## 2023-04-16 DIAGNOSIS — Z1231 Encounter for screening mammogram for malignant neoplasm of breast: Secondary | ICD-10-CM

## 2023-06-03 ENCOUNTER — Ambulatory Visit
Admission: RE | Admit: 2023-06-03 | Discharge: 2023-06-03 | Disposition: A | Discharge status: Home / Self Care | Payer: No Typology Code available for payment source | Source: Ambulatory Visit | Attending: Obstetrics and Gynecology | Admitting: Obstetrics and Gynecology

## 2023-06-03 ENCOUNTER — Ambulatory Visit: Payer: Self-pay | Admitting: Hematology and Oncology

## 2023-06-03 VITALS — BP 196/117 | Ht 61.0 in | Wt 220.0 lb

## 2023-06-03 DIAGNOSIS — Z01419 Encounter for gynecological examination (general) (routine) without abnormal findings: Secondary | ICD-10-CM

## 2023-06-03 DIAGNOSIS — Z124 Encounter for screening for malignant neoplasm of cervix: Secondary | ICD-10-CM

## 2023-06-03 DIAGNOSIS — Z1231 Encounter for screening mammogram for malignant neoplasm of breast: Secondary | ICD-10-CM

## 2023-06-03 NOTE — Progress Notes (Signed)
Ms. CASSIAH HARBERTS is a 52 y.o. No obstetric history on file. female who presents to [facility] clinic today with no complaints.    Pap Smear: Pap smear completed today. Last Pap smear was 11/29/2017 and was normal. Per patient has no history of an abnormal Pap smear. Last Pap smear result is available in Epic.   Physical exam: Breasts Breasts symmetrical. No skin abnormalities bilateral breasts. No nipple retraction bilateral breasts. No nipple discharge bilateral breasts. No lymphadenopathy. No lumps palpated bilateral breasts.  MM 3D SCREEN BREAST BILATERAL  Result Date: 03/03/2022 CLINICAL DATA:  Screening. EXAM: DIGITAL SCREENING BILATERAL MAMMOGRAM WITH TOMOSYNTHESIS AND CAD TECHNIQUE: Bilateral screening digital craniocaudal and mediolateral oblique mammograms were obtained. Bilateral screening digital breast tomosynthesis was performed. The images were evaluated with computer-aided detection. COMPARISON:  Previous exam(s). ACR Breast Density Category b: There are scattered areas of fibroglandular density. FINDINGS: There are no findings suspicious for malignancy. IMPRESSION: No mammographic evidence of malignancy. A result letter of this screening mammogram will be mailed directly to the patient. RECOMMENDATION: Screening mammogram in one year. (Code:[id number]) BI-RADS CATEGORY  1: Negative. Electronically Signed   By: Sande Brothers M.D.   On: 03/03/2022 14:19   MM DIAG BREAST TOMO BILATERAL  Result Date: 02/25/2021 CLINICAL DATA:  52 year old female presenting for follow-up of a likely benign left breast mass without sonographic correlate. EXAM: DIGITAL DIAGNOSTIC BILATERAL MAMMOGRAM WITH TOMOSYNTHESIS AND CAD TECHNIQUE: Bilateral digital diagnostic mammography and breast tomosynthesis was performed. The images were evaluated with computer-aided detection. COMPARISON:  Previous exam(s). ACR Breast Density Category b: There are scattered areas of fibroglandular density. FINDINGS: The mass in the  inferior left breast is mammographically stable. No suspicious calcifications, masses or areas of distortion are seen in the bilateral breasts. IMPRESSION: 1. The left breast mass has demonstrated 2 years of stability, and is therefore benign. 2.  No evidence of malignancy in the bilateral breasts. RECOMMENDATION: Screening mammogram in one year.(Code:[id number]) I have discussed the findings and recommendations with the patient. If applicable, a reminder letter will be sent to the patient regarding the next appointment. BI-RADS CATEGORY  2: Benign. Electronically Signed   By: Frederico Hamman M.D.   On: 02/25/2021 11:24   MM DIAG BREAST TOMO BILATERAL  Result Date: 02/13/2020 CLINICAL DATA:  Follow-up probably benign left breast mass with no ultrasound correlate. EXAM: DIGITAL DIAGNOSTIC BILATERAL MAMMOGRAM WITH CAD AND TOMO COMPARISON:  Previous exam(s). ACR Breast Density Category b: There are scattered areas of fibroglandular density. FINDINGS: The previously demonstrated small, rounded mass in the inferior central left breast is unchanged. No interval findings suspicious for malignancy in either breast. Mammographic images were processed with CAD. IMPRESSION: 1. Stable probably benign left breast mass. 2. No evidence of malignancy elsewhere in either breast. RECOMMENDATION: Bilateral diagnostic mammogram in 1 year to complete 2 years of follow-up of the probably benign left breast mass. I have discussed the findings and recommendations with the patient. If applicable, a reminder letter will be sent to the patient regarding the next appointment. BI-RADS CATEGORY  3: Probably benign. Electronically Signed   By: Beckie Salts M.D.   On: 02/13/2020 07:57   MM DIAG BREAST TOMO UNI LEFT  Result Date: 08/10/2019 CLINICAL DATA:  Short-term follow-up for a probably benign mass in the left breast. This was evaluated with diagnostic imaging on 02/08/2019. There was no sonographic correlate. EXAM: DIGITAL DIAGNOSTIC  UNILATERAL LEFT MAMMOGRAM WITH CAD AND TOMO COMPARISON:  Previous exam(s). ACR Breast Density Category b: There are  scattered areas of fibroglandular density. FINDINGS: Small mass noted in the inferior, central left breast is unchanged. There are no other masses, no areas of new or significant asymmetry and no areas of architectural distortion. There are no suspicious calcifications. Mammographic images were processed with CAD. IMPRESSION: Probably benign small left breast mass without a sonographic correlate. This has been stable for 6 months. Additional short-term follow-up recommended. RECOMMENDATION: Diagnostic mammography with possible ultrasound in 6 months. I have discussed the findings and recommendations with the patient. Results were also provided in writing at the conclusion of the visit. If applicable, a reminder letter will be sent to the patient regarding the next appointment. BI-RADS CATEGORY  3: Probably benign. Electronically Signed   By: Amie Portland M.D.   On: 08/10/2019 07:53   MM DIAG BREAST TOMO UNI LEFT  Result Date: 02/08/2019 CLINICAL DATA:  52 year old female recalled from screening mammogram dated 02/02/2019 for a possible left breast mass. EXAM: DIGITAL DIAGNOSTIC LEFT MAMMOGRAM WITH CAD AND TOMO ULTRASOUND LEFT BREAST COMPARISON:  Previous exam(s). ACR Breast Density Category b: There are scattered areas of fibroglandular density. FINDINGS: There is a persistent circumscribed mass in the lower central left breast at middle depth. Further evaluation with ultrasound was performed. In retrospect, this area seen on prior mammograms, though not as well evaluated due to lack of 3D technique. Mammographic images were processed with CAD. Targeted ultrasound is performed, showing normal fibroglandular tissue without focal or suspicious sonographic abnormality. Extensive evaluation of the entire inferior and subareolar left breast was performed. IMPRESSION: Probably benign left breast mass  without ultrasound correlate. Recommendation is for short-term imaging follow-up. RECOMMENDATION: Diagnostic left breast mammogram with possible ultrasound in 6 months. I have discussed the findings and recommendations with the patient. Results were also provided in writing at the conclusion of the visit. If applicable, a reminder letter will be sent to the patient regarding the next appointment. BI-RADS CATEGORY  3: Probably benign. Electronically Signed   By: Sande Brothers M.D.   On: 02/08/2019 08:07   MM DIGITAL SCREENING BILATERAL  Result Date: 02/02/2019 CLINICAL DATA:  Screening. EXAM: DIGITAL SCREENING BILATERAL MAMMOGRAM WITH CAD COMPARISON:  Previous exam(s). ACR Breast Density Category b: There are scattered areas of fibroglandular density. FINDINGS: In the left breast, a possible mass warrants further evaluation. In the right breast, no findings suspicious for malignancy. Images were processed with CAD. IMPRESSION: Further evaluation is suggested for possible mass in the left breast. RECOMMENDATION: Diagnostic mammogram and possibly ultrasound of the left breast. (Code:[id number]) The patient will be contacted regarding the findings, and additional imaging will be scheduled. BI-RADS CATEGORY  0: Incomplete. Need additional imaging evaluation and/or prior mammograms for comparison. Electronically Signed   By: Sherian Rein M.D.   On: 02/02/2019 08:21         Pelvic/Bimanual Ext Genitalia No lesions, no swelling and no discharge observed on external genitalia.        Vagina Vagina pink and normal texture. No lesions or discharge observed in vagina.        Cervix Cervix is present. Cervix pink and of normal texture. No discharge observed.    Uterus Uterus is present and palpable. Uterus in normal position and normal size.        Adnexae Bilateral ovaries present and palpable. No tenderness on palpation.         Rectovaginal No rectal exam completed today since patient had no rectal  complaints. No skin abnormalities observed on exam.  Smoking History: Patient has never smoked and was not referred to quit line.    Patient Navigation: Patient education provided. Access to services provided for patient through [facility] program. No interpreter provided. No transportation provided   Colorectal Cancer Screening: Per patient has had colonoscopy completed on 11/07/10 revealing polypoid fragment of benign colonic mucosa with melanosis coli and intramucosal lymphoid aggregate. No evidence of dysplasia adenomatous changes or malignancy.  No complaints today. Had follow up with Dr. Bosie Clos 2023.   Breast and Cervical Cancer Risk Assessment: Patient does not have family history of breast cancer, known genetic mutations, or radiation treatment to the chest before age 50. Patient does not have history of cervical dysplasia, immunocompromised, or DES exposure in-utero.  Risk Scores as of 06/03/2023     [name]           5-year 1.28 %   Lifetime 8.18 %            Last calculated by Caprice Red, CMA on 06/03/2023 at  9:50 AM        A: B[facility]CP exam with pap smear No complaints with benign exam.   P: Referred patient to the [facility] for a screening mammogram. Appointment scheduled 06/03/23.  Ilda Basset A, NP 06/03/2023 10:19 AM

## 2023-06-03 NOTE — Patient Instructions (Signed)
Ms. Lauren Le is a 52 y.o. female who presents to [facility] clinic today with no complaints this visit.    Pap Smear: Pap not smear completed today. Last Pap smear was 11/29/17 negative/HPV negative, and 11/01/12 negative/HPV negative. Per patient has no history of an abnormal Pap smear. Last Pap smear result is available in Epic.   Physical exam: Breasts Breasts symmetrical. No skin abnormalities bilateral breasts. No nipple retraction bilateral breasts. No nipple discharge bilateral breasts. No lymphadenopathy. No lumps palpated bilateral breasts.       Pelvic/Bimanual Pap completed today.    Smoking History: Patient has never smoked. Not referred to quit line.    Patient Navigation: Patient education provided. Access to services provided for patient through [facility] program. No interpreter provided. No transportation provided   Colorectal Cancer Screening: Per patient has had colonoscopy completed on 11/07/10.  No complaints today.    Breast and Cervical Cancer Risk Assessment: Patient does not have family history of breast cancer, known genetic mutations, or radiation treatment to the chest before age 61. Patient does not have history of cervical dysplasia, immunocompromised, or DES exposure in-utero.  Risk Scores as of 06/03/2023     Lauren Le           5-year 1.28 %   Lifetime 8.18 %            Last calculated by Caprice Red, CMA on 06/03/2023 at  9:50 AM        A: BCCCP exam with pap smear Complaint of none  P: Referred patient to the [facility] for a screening mammogram. Appointment scheduled 06/03/2023 at 10:30 am.  Joette Catching, RN 06/03/2023 10:28 AM

## 2023-06-07 LAB — CYTOLOGY - PAP
Adequacy: ABSENT
Comment: NEGATIVE
Diagnosis: NEGATIVE
High risk HPV: NEGATIVE

## 2023-11-01 DIAGNOSIS — G4719 Other hypersomnia: Secondary | ICD-10-CM | POA: Diagnosis not present

## 2023-11-01 DIAGNOSIS — I10 Essential (primary) hypertension: Secondary | ICD-10-CM | POA: Diagnosis not present

## 2023-12-02 DIAGNOSIS — E041 Nontoxic single thyroid nodule: Secondary | ICD-10-CM | POA: Diagnosis not present

## 2023-12-02 DIAGNOSIS — G4733 Obstructive sleep apnea (adult) (pediatric): Secondary | ICD-10-CM | POA: Diagnosis not present

## 2023-12-02 DIAGNOSIS — I10 Essential (primary) hypertension: Secondary | ICD-10-CM | POA: Diagnosis not present

## 2023-12-20 ENCOUNTER — Other Ambulatory Visit: Payer: Self-pay | Admitting: Family Medicine

## 2023-12-20 DIAGNOSIS — E041 Nontoxic single thyroid nodule: Secondary | ICD-10-CM

## 2023-12-20 DIAGNOSIS — Z124 Encounter for screening for malignant neoplasm of cervix: Secondary | ICD-10-CM | POA: Diagnosis not present

## 2023-12-23 ENCOUNTER — Ambulatory Visit
Admission: RE | Admit: 2023-12-23 | Discharge: 2023-12-23 | Disposition: A | Discharge status: Home / Self Care | Payer: 59 | Source: Ambulatory Visit | Attending: Family Medicine | Admitting: Family Medicine

## 2023-12-23 DIAGNOSIS — E042 Nontoxic multinodular goiter: Secondary | ICD-10-CM | POA: Diagnosis not present

## 2023-12-23 DIAGNOSIS — E041 Nontoxic single thyroid nodule: Secondary | ICD-10-CM

## 2024-04-19 ENCOUNTER — Other Ambulatory Visit: Payer: Self-pay | Admitting: Family Medicine

## 2024-04-19 DIAGNOSIS — Z1231 Encounter for screening mammogram for malignant neoplasm of breast: Secondary | ICD-10-CM

## 2024-05-03 ENCOUNTER — Ambulatory Visit
Admission: RE | Admit: 2024-05-03 | Discharge: 2024-05-03 | Disposition: A | Discharge status: Home / Self Care | Source: Ambulatory Visit | Attending: Family Medicine | Admitting: Family Medicine

## 2024-05-03 DIAGNOSIS — Z1231 Encounter for screening mammogram for malignant neoplasm of breast: Secondary | ICD-10-CM

## 2024-06-05 ENCOUNTER — Ambulatory Visit
Admission: RE | Admit: 2024-06-05 | Discharge: 2024-06-05 | Disposition: A | Discharge status: Home / Self Care | Source: Ambulatory Visit | Attending: Family Medicine | Admitting: Family Medicine

## 2024-06-05 DIAGNOSIS — Z1231 Encounter for screening mammogram for malignant neoplasm of breast: Secondary | ICD-10-CM | POA: Diagnosis not present

## 2024-12-20 DIAGNOSIS — D649 Anemia, unspecified: Secondary | ICD-10-CM | POA: Diagnosis not present

## 2024-12-20 DIAGNOSIS — G4733 Obstructive sleep apnea (adult) (pediatric): Secondary | ICD-10-CM | POA: Diagnosis not present

## 2024-12-20 DIAGNOSIS — Z0000 Encounter for general adult medical examination without abnormal findings: Secondary | ICD-10-CM | POA: Diagnosis not present

## 2024-12-20 DIAGNOSIS — E042 Nontoxic multinodular goiter: Secondary | ICD-10-CM | POA: Diagnosis not present

## 2024-12-20 DIAGNOSIS — R7303 Prediabetes: Secondary | ICD-10-CM | POA: Diagnosis not present

## 2024-12-20 DIAGNOSIS — E041 Nontoxic single thyroid nodule: Secondary | ICD-10-CM | POA: Diagnosis not present

## 2024-12-20 DIAGNOSIS — I10 Essential (primary) hypertension: Secondary | ICD-10-CM | POA: Diagnosis not present

## 2024-12-20 DIAGNOSIS — Z6841 Body Mass Index (BMI) 40.0 and over, adult: Secondary | ICD-10-CM | POA: Diagnosis not present

## 2025-02-02 ENCOUNTER — Encounter: Payer: Self-pay | Admitting: *Deleted

## 2025-02-02 NOTE — Progress Notes (Signed)
 Lauren Le                                          MRN: [id number]   02/02/2025   The [facility] Quality Team Specialist reviewed this patient medical record for the purposes of chart review for care gap closure. The following were reviewed: chart review for care gap closure-controlling blood pressure.    [facility]BCI Quality Team
